# Patient Record
Sex: Male | Born: 1970 | Race: Black or African American | Hispanic: No | Marital: Single | Smoking: Never smoker
Health system: Southern US, Community
[De-identification: ages and names within clinical notes are randomized; demographics above are authoritative.]

## PROBLEM LIST (undated history)

## (undated) DIAGNOSIS — K219 Gastro-esophageal reflux disease without esophagitis: Secondary | ICD-10-CM

## (undated) DIAGNOSIS — K439 Ventral hernia without obstruction or gangrene: Secondary | ICD-10-CM

---

## 2004-01-17 ENCOUNTER — Emergency Department (HOSPITAL_COMMUNITY): Admission: EM | Admit: 2004-01-17 | Discharge: 2004-01-17 | Payer: Self-pay | Admitting: Emergency Medicine

## 2004-01-25 ENCOUNTER — Emergency Department (HOSPITAL_COMMUNITY): Admission: EM | Admit: 2004-01-25 | Discharge: 2004-01-25 | Payer: Self-pay | Admitting: Family Medicine

## 2005-05-10 ENCOUNTER — Emergency Department (HOSPITAL_COMMUNITY): Admission: EM | Admit: 2005-05-10 | Discharge: 2005-05-10 | Payer: Self-pay | Admitting: Emergency Medicine

## 2005-05-22 ENCOUNTER — Emergency Department (HOSPITAL_COMMUNITY): Admission: EM | Admit: 2005-05-22 | Discharge: 2005-05-22 | Payer: Self-pay | Admitting: Emergency Medicine

## 2007-11-05 ENCOUNTER — Emergency Department (HOSPITAL_COMMUNITY): Admission: EM | Admit: 2007-11-05 | Discharge: 2007-11-05 | Payer: Self-pay | Admitting: *Deleted

## 2010-11-29 ENCOUNTER — Emergency Department (HOSPITAL_COMMUNITY)
Admission: EM | Admit: 2010-11-29 | Discharge: 2010-11-29 | Disposition: A | Discharge status: Home / Self Care | Payer: Worker's Compensation | Attending: Emergency Medicine | Admitting: Emergency Medicine

## 2010-11-29 DIAGNOSIS — Y93G1 Activity, food preparation and clean up: Secondary | ICD-10-CM | POA: Insufficient documentation

## 2010-11-29 DIAGNOSIS — Z23 Encounter for immunization: Secondary | ICD-10-CM | POA: Insufficient documentation

## 2010-11-29 DIAGNOSIS — S61409A Unspecified open wound of unspecified hand, initial encounter: Secondary | ICD-10-CM | POA: Insufficient documentation

## 2010-11-29 DIAGNOSIS — W260XXA Contact with knife, initial encounter: Secondary | ICD-10-CM | POA: Insufficient documentation

## 2013-10-20 ENCOUNTER — Emergency Department (INDEPENDENT_AMBULATORY_CARE_PROVIDER_SITE_OTHER)
Admission: EM | Admit: 2013-10-20 | Discharge: 2013-10-20 | Disposition: A | Discharge status: Home / Self Care | Payer: Self-pay | Source: Home / Self Care | Attending: Family Medicine | Admitting: Family Medicine

## 2013-10-20 ENCOUNTER — Encounter (HOSPITAL_COMMUNITY): Payer: Self-pay | Admitting: Emergency Medicine

## 2013-10-20 DIAGNOSIS — K047 Periapical abscess without sinus: Secondary | ICD-10-CM

## 2013-10-20 MED ORDER — CLINDAMYCIN HCL 300 MG PO CAPS: 300.0000 mg | ORAL_CAPSULE | Freq: Three times a day (TID) | ORAL | Status: DC

## 2013-10-20 NOTE — Discharge Instructions (Signed)
Take medicine as prescribed, see your dentist as soon as possible °

## 2013-10-20 NOTE — ED Notes (Signed)
C/o dental pain on left side of mouth States he has an abscess No dentist appt scheduled. Mouth is swollen and sensitive

## 2013-10-20 NOTE — ED Provider Notes (Signed)
CSN: [id number]     Arrival date & time 10/20/13  [id number] History   First MD Initiated Contact with Patient 10/20/13 [phone]     Chief Complaint  Patient presents with  . Dental Pain   (Consider location/radiation/quality/duration/timing/severity/associated sxs/prior Treatment) Patient is a 43 y.o. male presenting with tooth pain. The history is provided by the patient.  Dental Pain Location:  Lower Lower teeth location:  18/LL 2nd molar and 31/RL 2nd molar Quality:  Aching Severity:  Moderate Onset quality:  Sudden Duration:  1 day Progression:  Worsening Chronicity:  Recurrent Context: dental caries and poor dentition   Associated symptoms: facial swelling   Associated symptoms: no difficulty swallowing and no gum swelling   Risk factors: lack of dental care     History reviewed. No pertinent past medical history. No past surgical history on file. No family history on file. History  Substance Use Topics  . Smoking status: Not on file  . Smokeless tobacco: Not on file  . Alcohol Use: Not on file    Review of Systems  Constitutional: Negative.   HENT: Positive for dental problem and facial swelling.     Allergies  Review of patient's allergies indicates no known allergies.  Home Medications   Prior to Admission medications   Medication Sig Start Date End Date Taking? Authorizing Provider  clindamycin (CLEOCIN) 300 MG capsule Take 1 capsule (300 mg total) by mouth 3 (three) times daily. 10/20/13   Linna HoffJames D Mariely Mahr, MD   BP 140/98  Pulse 83  Temp(Src) 98.2 F (36.8 C) (Oral)  Resp 18  Ht 5' 8.5" (1.74 m)  Wt 140 lb (63.504 kg)  BMI 20.98 kg/m2  SpO2 96% Physical Exam  Nursing note and vitals reviewed. Constitutional: He is oriented to person, place, and time. He appears well-developed and well-nourished. No distress.  HENT:  Mouth/Throat: Oropharynx is clear and moist. Abnormal dentition. Dental caries present.    Left mandible sts, nontender.  Neck: Normal range of  motion. Neck supple.  Lymphadenopathy:    He has no cervical adenopathy.  Neurological: He is alert and oriented to person, place, and time.  Skin: Skin is warm and dry.    ED Course  Procedures (including critical care time) Labs Review Labs Reviewed - No data to display  Imaging Review No results found.   MDM   1. Abscess, dental        Linna HoffJames D Jaser Fullen, MD 10/20/13 [phone]

## 2015-06-26 ENCOUNTER — Emergency Department (HOSPITAL_COMMUNITY)
Admission: EM | Admit: 2015-06-26 | Discharge: 2015-06-26 | Disposition: A | Discharge status: Home / Self Care | Payer: Worker's Compensation | Attending: Emergency Medicine | Admitting: Emergency Medicine

## 2015-06-26 ENCOUNTER — Encounter (HOSPITAL_COMMUNITY): Payer: Self-pay | Admitting: Family Medicine

## 2015-06-26 DIAGNOSIS — A084 Viral intestinal infection, unspecified: Secondary | ICD-10-CM

## 2015-06-26 DIAGNOSIS — Z792 Long term (current) use of antibiotics: Secondary | ICD-10-CM | POA: Insufficient documentation

## 2015-06-26 LAB — URINE MICROSCOPIC-ADD ON
Bacteria, UA: NONE SEEN
Squamous Epithelial / HPF: NONE SEEN

## 2015-06-26 LAB — URINALYSIS, ROUTINE W REFLEX MICROSCOPIC
Bilirubin Urine: NEGATIVE
Glucose, UA: NEGATIVE mg/dL
Ketones, ur: NEGATIVE mg/dL
Leukocytes, UA: NEGATIVE
Nitrite: NEGATIVE
Protein, ur: 30 mg/dL — AB
Specific Gravity, Urine: 1.024 (ref 1.005–1.030)
pH: 6 (ref 5.0–8.0)

## 2015-06-26 LAB — COMPREHENSIVE METABOLIC PANEL
ALT: 14 U/L — ABNORMAL LOW (ref 17–63)
AST: 21 U/L (ref 15–41)
Albumin: 4 g/dL (ref 3.5–5.0)
Alkaline Phosphatase: 40 U/L (ref 38–126)
Anion gap: 14 (ref 5–15)
BUN: 15 mg/dL (ref 6–20)
CO2: 22 mmol/L (ref 22–32)
Calcium: 9.5 mg/dL (ref 8.9–10.3)
Chloride: 98 mmol/L — ABNORMAL LOW (ref 101–111)
Creatinine, Ser: 1.15 mg/dL (ref 0.61–1.24)
GFR calc Af Amer: >60 mL/min (ref >60)
GFR calc non Af Amer: >60 mL/min (ref >60)
Glucose, Bld: 160 mg/dL — ABNORMAL HIGH (ref 65–99)
Potassium: 4.1 mmol/L (ref 3.5–5.1)
Sodium: 134 mmol/L — ABNORMAL LOW (ref 135–145)
Total Bilirubin: 1.1 mg/dL (ref 0.3–1.2)
Total Protein: 7.6 g/dL (ref 6.5–8.1)

## 2015-06-26 LAB — CBC
HCT: 44.1 % (ref 39.0–52.0)
Hemoglobin: 15.5 g/dL (ref 13.0–17.0)
MCH: 30.8 pg (ref 26.0–34.0)
MCHC: 35.1 g/dL (ref 30.0–36.0)
MCV: 87.5 fL (ref 78.0–100.0)
Platelets: 194 10*3/uL (ref 150–400)
RBC: 5.04 MIL/uL (ref 4.22–5.81)
RDW: 11.6 % (ref 11.5–15.5)
WBC: 5.9 10*3/uL (ref 4.0–10.5)

## 2015-06-26 LAB — LIPASE, BLOOD: Lipase: 18 U/L (ref 11–51)

## 2015-06-26 MED ORDER — SODIUM CHLORIDE 0.9 % IV BOLUS (SEPSIS)
1000.0000 mL | Freq: Once | INTRAVENOUS | Status: AC
  Administered 2015-06-26: 1000 mL via INTRAVENOUS

## 2015-06-26 MED ORDER — ONDANSETRON HCL 4 MG/2ML IJ SOLN
4.0000 mg | Freq: Once | INTRAMUSCULAR | Status: AC
  Administered 2015-06-26: 4 mg via INTRAVENOUS
  Filled 2015-06-26: qty 2

## 2015-06-26 MED ORDER — ONDANSETRON 4 MG PO TBDP: 4.0000 mg | ORAL_TABLET | Freq: Three times a day (TID) | ORAL | Status: DC | PRN

## 2015-06-26 NOTE — ED Notes (Signed)
Pt stable, ambulatory, states understanding of discharge instructions 

## 2015-06-26 NOTE — ED Provider Notes (Signed)
CSN: [id number]     Arrival date & time 06/26/15  1254 History   First MD Initiated Contact with Patient 06/26/15 1407     Chief Complaint  Patient presents with  . Nausea  . Emesis  . Diarrhea     (Consider location/radiation/quality/duration/timing/severity/associated sxs/prior Treatment) HPI   Dalton Mason is a 45 y.o M with no significant pmhx who presents to the ED today c/o emesis and weakness. Pt states that 4 days ago he had multiple episodes of diarrhea and emesis. Pt states this continued for 2 days. Since that time pt feels very weak. He has not had much to eat or drink over the last 4 days. He states that he feels dehydrated. While in the ED, pt developed the hiccups and began coughing which resulted in posttussive emesis. Denies abdominal pain, fevers, chills, dysuria, melena, hematochezia, dizziness, back or flank pain.   History reviewed. No pertinent past medical history. History reviewed. No pertinent past surgical history. History reviewed. No pertinent family history. Social History  Substance Use Topics  . Smoking status: Never Smoker   . Smokeless tobacco: None  . Alcohol Use: No    Review of Systems  All other systems reviewed and are negative.     Allergies  Review of patient's allergies indicates no known allergies.  Home Medications   Prior to Admission medications   Medication Sig Start Date End Date Taking? Authorizing Provider  clindamycin (CLEOCIN) 300 MG capsule Take 1 capsule (300 mg total) by mouth 3 (three) times daily. 10/20/13   Linna Hoff, MD   BP 151/115 mmHg  Pulse 78  Temp(Src) 97.5 F (36.4 C)  Resp 18  SpO2 100% Physical Exam  Constitutional: He is oriented to person, place, and time. He appears well-developed and well-nourished. No distress.  HENT:  Head: Normocephalic and atraumatic.  Mouth/Throat: No oropharyngeal exudate.  Eyes: Conjunctivae and EOM are normal. Pupils are equal, round, and reactive to light. Right eye  exhibits no discharge. Left eye exhibits no discharge. No scleral icterus.  Neck: Neck supple.  Cardiovascular: Normal rate, regular rhythm, normal heart sounds and intact distal pulses.  Exam reveals no gallop and no friction rub.   No murmur heard. Pulmonary/Chest: Effort normal and breath sounds normal. No respiratory distress. He has no wheezes. He has no rales. He exhibits no tenderness.  Abdominal: Soft. He exhibits no distension and no mass. There is no tenderness. There is no rebound and no guarding.  Musculoskeletal: Normal range of motion. He exhibits no edema.  Lymphadenopathy:    He has no cervical adenopathy.  Neurological: He is alert and oriented to person, place, and time.  Strength 5/5 throughout. No sensory deficits.    Skin: Skin is warm and dry. No rash noted. He is not diaphoretic. No erythema. No pallor.  Psychiatric: He has a normal mood and affect. His behavior is normal.  Nursing note and vitals reviewed.   ED Course  Procedures (including critical care time) Labs Review Labs Reviewed  CBC  LIPASE, BLOOD  COMPREHENSIVE METABOLIC PANEL  URINALYSIS, ROUTINE W REFLEX MICROSCOPIC (NOT AT [facility])    Imaging Review No results found. I have personally reviewed and evaluated these images and lab results as part of my medical decision-making.   EKG Interpretation None      MDM   Final diagnoses:  Viral gastroenteritis    Patient with symptoms consistent with viral gastroenteritis.  Vitals are stable, no fever.  Pt given IV fluids and zofran  with significant symptomatic relief. No signs of dehydration, tolerating PO fluids > 6 oz.  Lungs are clear.  No focal abdominal pain, no concern for appendicitis, cholecystitis, pancreatitis, ruptured viscus, UTI, kidney stone, or any other abdominal etiology. All lab work wnl. Supportive therapy indicated with return if symptoms worsen.  Patient counseled.     Lester KinsmanSamantha Tripp CloverDowless, PA-C 06/26/15 2045  Lorre NickAnthony  Allen, MD 06/29/15 [phone]

## 2015-06-26 NOTE — Discharge Instructions (Signed)

## 2015-06-26 NOTE — ED Notes (Signed)
Pt here for viral symptoms since Thursday. sts N,V,D until 2 days ago. He sta feels weak and dehydrated. sts some abd pain.

## 2015-11-05 ENCOUNTER — Emergency Department (HOSPITAL_COMMUNITY)
Admission: EM | Admit: 2015-11-05 | Discharge: 2015-11-05 | Disposition: A | Discharge status: Home / Self Care | Payer: Worker's Compensation | Attending: Physician Assistant | Admitting: Physician Assistant

## 2015-11-05 ENCOUNTER — Encounter (HOSPITAL_COMMUNITY): Payer: Self-pay

## 2015-11-05 ENCOUNTER — Emergency Department (HOSPITAL_COMMUNITY): Payer: Worker's Compensation

## 2015-11-05 DIAGNOSIS — R05 Cough: Secondary | ICD-10-CM

## 2015-11-05 DIAGNOSIS — R059 Cough, unspecified: Secondary | ICD-10-CM

## 2015-11-05 MED ORDER — PREDNISONE 20 MG PO TABS: 40.0000 mg | ORAL_TABLET | Freq: Every day | ORAL | Status: DC

## 2015-11-05 MED ORDER — CETIRIZINE-PSEUDOEPHEDRINE ER 5-120 MG PO TB12: 1.0000 | ORAL_TABLET | Freq: Every day | ORAL | Status: DC

## 2015-11-05 NOTE — ED Provider Notes (Signed)
History  By signing my name below, I, Earmon Phoenix, attest that this documentation has been prepared under the direction and in the presence of Quinlee Sciarra, PA-C. Electronically Signed: Earmon Phoenix, ED Scribe. 11/05/2015. 12:33 PM.  Chief Complaint  Patient presents with  . Cough   The history is provided by the patient and medical records. No language interpreter was used.    HPI Comments:  Dalton Mason is a 45 y.o. male who presents to the Emergency Department complaining of an intermittent productive cough of phlegm that began about one month ago. He reports occasional sneezing. He has been taking an OTC cough medication with no significant relief of the symptoms. Exertion causes him to cough more. He denies alleviating factors. He denies fever, chills, HA, nasal congestion, post nasal drip, nausea, vomiting, SOB, wheezing. He denies ever smoking. He denies h/o asthma. He does not have a PCP.   History reviewed. No pertinent past medical history. History reviewed. No pertinent past surgical history. No family history on file. Social History  Substance Use Topics  . Smoking status: Never Smoker   . Smokeless tobacco: None  . Alcohol Use: No    Review of Systems  Constitutional: Negative for fever and chills.  HENT: Positive for sneezing. Negative for congestion and postnasal drip.   Respiratory: Positive for cough. Negative for shortness of breath and wheezing.   Gastrointestinal: Negative for nausea and vomiting.  Neurological: Negative for headaches.    Allergies  Review of patient's allergies indicates no known allergies.  Home Medications   Prior to Admission medications   Medication Sig Start Date End Date Taking? Authorizing Provider  clindamycin (CLEOCIN) 300 MG capsule Take 1 capsule (300 mg total) by mouth 3 (three) times daily. 10/20/13   Linna Hoff, MD  ondansetron (ZOFRAN ODT) 4 MG disintegrating tablet Take 1 tablet (4 mg total) by mouth every  8 (eight) hours as needed for nausea or vomiting. 06/26/15   Dub Mikes, PA-C   Triage Vitals: BP 146/89 mmHg  Pulse 74  Temp(Src) 98.4 F (36.9 C) (Oral)  Resp 18  Ht  (1.727 m)  Wt 140 lb (63.504 kg)  BMI 21.29 kg/m2  SpO2 98% Physical Exam  Constitutional: He is oriented to person, place, and time. He appears well-developed and well-nourished.  HENT:  Head: Normocephalic and atraumatic.  Mouth/Throat: Oropharynx is clear and moist.  Eyes: Conjunctivae and EOM are normal. Pupils are equal, round, and reactive to light.  Neck: Normal range of motion.  Cardiovascular: Normal rate, regular rhythm and normal heart sounds.   Pulmonary/Chest: Effort normal and breath sounds normal. No respiratory distress. He has no wheezes. He has no rales.  Musculoskeletal: Normal range of motion.  Neurological: He is alert and oriented to person, place, and time.  Skin: Skin is warm and dry.  Psychiatric: He has a normal mood and affect. His behavior is normal.  Nursing note and vitals reviewed.   ED Course  Procedures (including critical care time) DIAGNOSTIC STUDIES: Oxygen Saturation is 98% on RA, normal by my interpretation.   COORDINATION OF CARE: 12:33 PM- Will prescribe steroid and antihistamine. Pt verbalizes understanding and agrees to plan.  Medications - No data to display  Labs Review Labs Reviewed - No data to display  Imaging Review Dg Chest 2 View  11/05/2015  CLINICAL DATA:  X 1 month patient has had a cough. EXAM: CHEST  2 VIEW COMPARISON:  None. FINDINGS: Midline trachea. Normal heart size and  mediastinal contours. No pleural effusion or pneumothorax. Clear lungs. Minimal biapical pleural-parenchymal scarring. Mild S-shaped thoracolumbar spine curvature. IMPRESSION: No acute cardiopulmonary disease. Electronically Signed   By: Jeronimo Greaves M.D.   On: 11/05/2015 12:28   I have personally reviewed and evaluated these images and lab results as part of my  medical decision-making.   EKG Interpretation None      MDM   Final diagnoses:  Cough    Patient with cough for about a month, states feels mucus in his throat that's causing him to cough. No other associated symptoms or complaints. Afebrile. Nontoxic-appearing. Normal oxygen saturation respiratory rate. No chest pain or shortness of breath. X-ray is negative. Question inflammatory processes versus allergies. Will start on Zyrtec, prednisone for inflammation for 5 days, short bursts, will follow up with family doctor.  Filed Vitals:   11/05/15 1126  BP: 146/89  Pulse: 74  Temp: 98.4 F (36.9 C)  TempSrc: Oral  Resp: 18  Height: 5\' 8"  (1.727 m)  Weight: 63.504 kg  SpO2: 98%    I personally performed the services described in this documentation, which was scribed in my presence. The recorded information has been reviewed and is accurate.   Jaynie Crumble, PA-C 11/05/15 1248  Courteney Randall An, MD 11/05/15 [phone]

## 2015-11-05 NOTE — ED Notes (Signed)
Patient here with cough x 1 month, reports feels like mucus sitting in throat, NAD. Thinks allergies or sinus drainage

## 2015-11-05 NOTE — Discharge Instructions (Signed)
Take zyrtec d daily. Take prednisone as prescribed until all gone. Follow up with family doctor for recheck. Return if worsening.    Allergies An allergy is an abnormal reaction to a substance by the body's defense system (immune system). Allergies can develop at any age. WHAT CAUSES ALLERGIES? An allergic reaction happens when the immune system mistakenly reacts to a normally harmless substance, called an allergen, as if it were harmful. The immune system releases antibodies to fight the substance. Antibodies eventually release a chemical called histamine into the bloodstream. The release of histamine is meant to protect the body from infection, but it also causes discomfort. An allergic reaction can be triggered by:  Eating an allergen.  Inhaling an allergen.  Touching an allergen. WHAT TYPES OF ALLERGIES ARE THERE? There are many types of allergies. Common types include:  Seasonal allergies. People with this type of allergy are usually allergic to substances that are only present during certain seasons, such as molds and pollens.  Food allergies.  Drug allergies.  Insect allergies.  Animal dander allergies. WHAT ARE SYMPTOMS OF ALLERGIES? Possible allergy symptoms include:  Swelling of the lips, face, tongue, mouth, or throat.  Sneezing, coughing, or wheezing.  Nasal congestion.  Tingling in the mouth.  Rash.  Itching.  Itchy, red, swollen areas of skin (hives).  Watery eyes.  Vomiting.  Diarrhea.  Dizziness.  Lightheadedness.  Fainting.  Trouble breathing or swallowing.  Chest tightness.  Rapid heartbeat. HOW ARE ALLERGIES DIAGNOSED? Allergies are diagnosed with a medical and family history and one or more of the following:  Skin tests.  Blood tests.  A food diary. A food diary is a record of all the foods and drinks you have in a day and of all the symptoms you experience.  The results of an elimination diet. An elimination diet involves  eliminating foods from your diet and then adding them back in one by one to find out if a certain food causes an allergic reaction. HOW ARE ALLERGIES TREATED? There is no cure for allergies, but allergic reactions can be treated with medicine. Severe reactions usually need to be treated at a hospital. HOW CAN REACTIONS BE PREVENTED? The best way to prevent an allergic reaction is by avoiding the substance you are allergic to. Allergy shots and medicines can also help prevent reactions in some cases. People with severe allergic reactions may be able to prevent a life-threatening reaction called anaphylaxis with a medicine given right after exposure to the allergen.   This information is not intended to replace advice given to you by your health care provider. Make sure you discuss any questions you have with your health care provider.   Document Released: 06/26/2002 Document Revised: 04/23/2014 Document Reviewed: 01/12/2014 Elsevier Interactive Patient Education [employer]

## 2016-01-30 ENCOUNTER — Encounter (HOSPITAL_COMMUNITY): Payer: Self-pay | Admitting: Emergency Medicine

## 2016-01-30 ENCOUNTER — Ambulatory Visit (HOSPITAL_COMMUNITY)
Admission: EM | Admit: 2016-01-30 | Discharge: 2016-01-30 | Disposition: A | Discharge status: Home / Self Care | Payer: Self-pay | Attending: Emergency Medicine | Admitting: Emergency Medicine

## 2016-01-30 DIAGNOSIS — A09 Infectious gastroenteritis and colitis, unspecified: Secondary | ICD-10-CM

## 2016-01-30 DIAGNOSIS — R197 Diarrhea, unspecified: Secondary | ICD-10-CM

## 2016-01-30 NOTE — ED Provider Notes (Signed)
CSN: [id number]     Arrival date & time 01/30/16  1419 History   First MD Initiated Contact with Patient 01/30/16 1630     Chief Complaint  Patient presents with  . 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 He states it started out with watery diarrhea stools without evidence of blood after eating 3 days ago. He had watery to loose stools until today. This morning his stools were loose and beginning to form although still "runny". Denies associated abdominal pain or discomfort and no fever or chills. He has had no diarrhea since he ate breakfast this morning. He states he feels well otherwise.      History reviewed. No pertinent past medical history. History reviewed. No pertinent surgical history. No family history on file. Social History  Substance Use Topics  . Smoking status: Never Smoker  . Smokeless tobacco: Never Used  . Alcohol use No    Review of Systems  Constitutional: Positive for activity change. Negative for chills and fever.  HENT: Negative.   Respiratory: Negative.   Cardiovascular: Negative.   Gastrointestinal: Positive for diarrhea. Negative for abdominal distention, abdominal pain, blood in stool, constipation, nausea, rectal pain and vomiting.  Genitourinary: Negative.   Musculoskeletal: Negative.   Neurological: Negative for dizziness and headaches.  All other systems reviewed and are negative.   Allergies  Review of patient's allergies indicates no known allergies.  Home Medications   Prior to Admission medications   Medication Sig Start Date End Date Taking? Authorizing Provider  cetirizine-pseudoephedrine (ZYRTEC-D) 5-120 MG tablet Take 1 tablet by mouth daily. 11/05/15   Tatyana Kirichenko, PA-C   Meds Ordered and Administered this Visit  Medications - No data to display  BP 130/89 (BP  Location: Left Arm)   Pulse 64   Temp 98.3 F (36.8 C) (Oral)   Resp 12   SpO2 100%  No data found.   Physical Exam  Constitutional: He is oriented to person, place, and time. He appears well-developed and well-nourished. No distress.  Eyes: EOM are normal.  Neck: Neck supple.  Cardiovascular: Normal rate, regular rhythm and normal heart sounds.   Pulmonary/Chest: Effort normal and breath sounds normal. No respiratory distress. He has no wheezes.  Abdominal: Soft. Bowel sounds are normal. He exhibits no distension and no mass. There is no tenderness. There is no rebound and no guarding.  Musculoskeletal: He exhibits no edema.  Neurological: He is alert and oriented to person, place, and time. He exhibits normal muscle tone.  Skin: Skin is warm and dry.  Psychiatric: He has a normal mood and affect.  Nursing note and vitals reviewed.   Urgent Care Course   Clinical Course    Procedures (including critical care time)  Labs Review Labs Reviewed - No data to display  Imaging Review No results found.   Visual Acuity Review  Right Eye Distance:   Left Eye Distance:   Bilateral Distance:    Right Eye Near:   Left Eye Near:    Bilateral Near:         MDM   1. Diarrhea of presumed infectious origin    Sounds like your diarrhea is improving. Drink plenty of clear liquids for rehydration. Stay away from dairy products, fatty or greasy foods, fast food's for the next 24-48 hours. Eat bland meals, oatmeal, cooked vegetables. Make sure you wash her hands frequently and after using the  bathroom. Read the instructions associated with these papers. Out of work tomorrow and if having no symptoms of following day may return to work.     Hayden Rasmussen, NP 01/30/16 [phone]

## 2016-01-30 NOTE — Discharge Instructions (Signed)
Sounds like your diarrhea is improving. Drink plenty of clear liquids for rehydration. Stay away from dairy products, fatty or greasy foods, fast food's for the next 24-48 hours. Eat bland meals, oatmeal, cooked vegetables. Make sure you wash her hands frequently and after using the bathroom. Read the instructions associated with these papers. Out of work tomorrow and if having no symptoms of following day may return to work.

## 2016-01-30 NOTE — ED Triage Notes (Signed)
Patient reports Friday evening he started with abdominal cramping after eating.  Patient continued with diarrhea after eating or drinking over the week end.  Patient has continued bubbly stomach, but diarrhea episodes have decreased and had a semi-soft stool this morning.  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

## 2016-05-25 ENCOUNTER — Ambulatory Visit (HOSPITAL_COMMUNITY)
Admission: EM | Admit: 2016-05-25 | Discharge: 2016-05-25 | Disposition: A | Discharge status: Home / Self Care | Payer: Self-pay | Attending: Family Medicine | Admitting: Family Medicine

## 2016-05-25 ENCOUNTER — Encounter (HOSPITAL_COMMUNITY): Payer: Self-pay | Admitting: Emergency Medicine

## 2016-05-25 ENCOUNTER — Ambulatory Visit (INDEPENDENT_AMBULATORY_CARE_PROVIDER_SITE_OTHER): Payer: Self-pay

## 2016-05-25 DIAGNOSIS — J40 Bronchitis, not specified as acute or chronic: Secondary | ICD-10-CM

## 2016-05-25 DIAGNOSIS — H6122 Impacted cerumen, left ear: Secondary | ICD-10-CM

## 2016-05-25 DIAGNOSIS — K4090 Unilateral inguinal hernia, without obstruction or gangrene, not specified as recurrent: Secondary | ICD-10-CM

## 2016-05-25 DIAGNOSIS — J410 Simple chronic bronchitis: Secondary | ICD-10-CM

## 2016-05-25 MED ORDER — PREDNISONE 20 MG PO TABS: ORAL_TABLET | ORAL | 0 refills | Status: DC

## 2016-05-25 MED ORDER — OMEPRAZOLE 20 MG PO CPDR: 20.0000 mg | DELAYED_RELEASE_CAPSULE | Freq: Every day | ORAL | 0 refills | Status: DC

## 2016-05-25 NOTE — ED Provider Notes (Signed)
[facility]    CSN: [id number] Arrival date & time: 05/25/16  1655     History   Chief Complaint Chief Complaint  Patient presents with  . Cough    HPI Dalton Mason is a 46 y.o. male.   Pt reports having a cough for one year.  He was prescribed Zyrtec at one time, but it does not help. He also reports a productive cough, yellow phlegm. He says that actually he's had a cough since 2006 and he associates this with a hernia in his right groin.  He also notes that he can't hear well out of his left ear and would like his ear cleaned out.  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 He does not smoke or drink.      History reviewed. No pertinent past medical history.  There are no active problems to display for this patient.   History reviewed. No pertinent surgical history.     Home Medications    Prior to Admission medications   Medication Sig Start Date End Date Taking? Authorizing Provider  cetirizine-pseudoephedrine (ZYRTEC-D) 5-120 MG tablet Take 1 tablet by mouth daily. 11/05/15  Yes Tatyana Kirichenko, PA-C  omeprazole (PRILOSEC) 20 MG capsule Take 1 capsule (20 mg total) by mouth daily. 05/25/16   Elvina Sidle, MD  predniSONE (DELTASONE) 20 MG tablet Two daily with food 05/25/16   Elvina Sidle, MD    Family History History reviewed. No pertinent family history.  Social History Social History  Substance Use Topics  . Smoking status: Never Smoker  . Smokeless tobacco: Never Used  . Alcohol use No     Allergies   Patient has no known allergies.   Review of Systems Review of Systems  Constitutional: Negative.   HENT: Positive for hearing loss.   Respiratory: Positive for cough.   Gastrointestinal: Positive for abdominal pain.  Genitourinary: Negative.   Musculoskeletal: Negative.      Physical Exam Triage Vital Signs ED Triage Vitals [05/25/16 1717]  Enc Vitals Group     BP 128/83     Pulse Rate 80     Resp      Temp 99.4 F  (37.4 C)     Temp Source Oral     SpO2 98 %     Weight      Height      Head Circumference      Peak Flow      Pain Score 0     Pain Loc      Pain Edu?      Excl. in GC?    No data found.   Updated Vital Signs BP 128/83 (BP Location: Left Arm)   Pulse 80   Temp 99.4 F (37.4 C) (Oral)   SpO2 98%    Physical Exam  Constitutional: He is oriented to person, place, and time. He appears well-developed and well-nourished.  HENT:  Head: Normocephalic.  Right Ear: External ear normal.  Poor dental hygiene Left cerumen impaction  Eyes: Conjunctivae and EOM are normal. Pupils are equal, round, and reactive to light.  Neck: Normal range of motion. Neck supple.  Cardiovascular: Normal rate, regular rhythm and normal heart sounds.   Pulmonary/Chest: Effort normal. He has wheezes. He has rales.  Musculoskeletal: Normal range of motion.  Neurological: He is alert and oriented to person, place, and time.  Skin: Skin is warm and dry.  Nursing note and vitals reviewed.    UC Treatments / Results  Labs (all  labs ordered are listed, but only abnormal results are displayed) Labs Reviewed - No data to display  EKG  EKG Interpretation None       Radiology No results found.  Procedures Procedures (including critical care time)  Medications Ordered in UC Medications - No data to display   Initial Impression / Assessment and Plan / UC Course  I have reviewed the triage vital signs and the nursing notes.  Pertinent labs & imaging results that were available during my care of the patient were reviewed by me and considered in my medical decision making (see chart for details).     Final Clinical Impressions(s) / UC Diagnoses   Final diagnoses:  Bronchitis  Impacted cerumen of left ear  Simple chronic bronchitis (HCC)  Inguinal hernia of right side without obstruction or gangrene    New Prescriptions New Prescriptions   OMEPRAZOLE (PRILOSEC) 20 MG CAPSULE    Take 1  capsule (20 mg total) by mouth daily.   PREDNISONE (DELTASONE) 20 MG TABLET    Two daily with food     Elvina SidleKurt Leila Schuff, MD 05/25/16 [phone]

## 2016-05-25 NOTE — ED Triage Notes (Signed)
Pt reports having a cough for one year.  He was prescribed Zyrtec at one time, but it does not help. He also reports a productive cough, yellow phlegm.

## 2016-05-25 NOTE — Discharge Instructions (Signed)
For hernia repair, call [facility]

## 2016-10-16 ENCOUNTER — Ambulatory Visit (HOSPITAL_COMMUNITY)
Admission: EM | Admit: 2016-10-16 | Discharge: 2016-10-16 | Disposition: A | Discharge status: Home / Self Care | Payer: Self-pay | Attending: Internal Medicine | Admitting: Internal Medicine

## 2016-10-16 ENCOUNTER — Encounter (HOSPITAL_COMMUNITY): Payer: Self-pay | Admitting: *Deleted

## 2016-10-16 DIAGNOSIS — B349 Viral infection, unspecified: Secondary | ICD-10-CM

## 2016-10-16 NOTE — ED Triage Notes (Signed)
Fever        Yesterday       Weak   decreased   Appetite   No  Vomiting       no  Diarrhea

## 2016-10-16 NOTE — ED Notes (Signed)
Bed: UCTR Expected date:  Expected time:  Means of arrival: Car Comments:

## 2016-10-16 NOTE — ED Provider Notes (Signed)
CSN: [id number]     Arrival date & time 10/16/16  1201 History   None    Chief Complaint  Patient presents with  . Fever   (Consider location/radiation/quality/duration/timing/severity/associated sxs/prior Treatment) Patient c/o fever yesterday, low appetite, and fatigue.  He was sent home from work today.   The history is provided by the patient.  Fever  Temp source:  Subjective Severity:  Moderate Onset quality:  Sudden Duration:  2 days Timing:  Constant Chronicity:  New Relieved by:  Nothing Worsened by:  Nothing Ineffective treatments:  None tried   History reviewed. No pertinent past medical history. History reviewed. No pertinent surgical history. History reviewed. No pertinent family history. Social History  Substance Use Topics  . Smoking status: Never Smoker  . Smokeless tobacco: Never Used  . Alcohol use No    Review of Systems  Constitutional: Positive for fatigue and fever.  HENT: Negative.   Eyes: Negative.   Respiratory: Negative.   Cardiovascular: Negative.   Gastrointestinal: Negative.   Endocrine: Negative.   Genitourinary: Negative.   Musculoskeletal: Negative.   Allergic/Immunologic: Negative.   Neurological: Negative.   Hematological: Negative.   Psychiatric/Behavioral: Negative.     Allergies  Patient has no known allergies.  Home Medications   Prior to Admission medications   Medication Sig Start Date End Date Taking? Authorizing Provider  ibuprofen (ADVIL,MOTRIN) 200 MG tablet Take 200 mg by mouth every 6 (six) hours as needed.   Yes [provider]  cetirizine-pseudoephedrine (ZYRTEC-D) 5-120 MG tablet Take 1 tablet by mouth daily. 11/05/15   Kirichenko, Lemont Fillersatyana, PA-C  omeprazole (PRILOSEC) 20 MG capsule Take 1 capsule (20 mg total) by mouth daily. 05/25/16   Elvina SidleLauenstein, Kurt, MD  predniSONE (DELTASONE) 20 MG tablet Two daily with food 05/25/16   Elvina SidleLauenstein, Kurt, MD   Meds Ordered and Administered this Visit  Medications - No  data to display  BP (!) 145/97 (BP Location: Left Arm) Comment: notified rn  Pulse 79   Temp 97.7 F (36.5 C) (Oral)   Resp 16   SpO2 100%  No data found.   Physical Exam  Constitutional: He is oriented to person, place, and time. He appears well-developed and well-nourished.  HENT:  Head: Normocephalic and atraumatic.  Eyes: Conjunctivae and EOM are normal. Pupils are equal, round, and reactive to light.  Neck: Normal range of motion. Neck supple.  Cardiovascular: Normal rate, regular rhythm and normal heart sounds.   Pulmonary/Chest: Effort normal and breath sounds normal.  Abdominal: Soft. Bowel sounds are normal.  Neurological: He is alert and oriented to person, place, and time.  Nursing note and vitals reviewed.   Urgent Care Course     Procedures (including critical care time)  Labs Review Labs Reviewed - No data to display  Imaging Review No results found.   Visual Acuity Review  Right Eye Distance:   Left Eye Distance:   Bilateral Distance:    Right Eye Near:   Left Eye Near:    Bilateral Near:         MDM   1. Viral syndrome   Take Nausea meds from home as rx'd if nauseated Work note Push po fluids, rest, tylenol and motrin otc prn as directed for fever, arthralgias, and myalgias.  Follow up prn if sx's continue or persist.    Deatra CanterOxford, Marita Burnsed J, FNP 10/16/16 1357

## 2016-10-16 NOTE — Discharge Instructions (Signed)
Push po fluids, rest, tylenol and motrin otc prn as directed for fever, arthralgias, and myalgias.  Follow up prn if sx's continue or persist.  Take nausea medicine from home if nauseated.

## 2017-02-05 IMAGING — DX DG CHEST 2V
2 series · 2 of 2 positions shown · non-contrast
Comparison: None.

CLINICAL DATA: X 1 month patient has had a cough.

EXAM:
CHEST  2 VIEW

[w chest lat (1 of 2)]
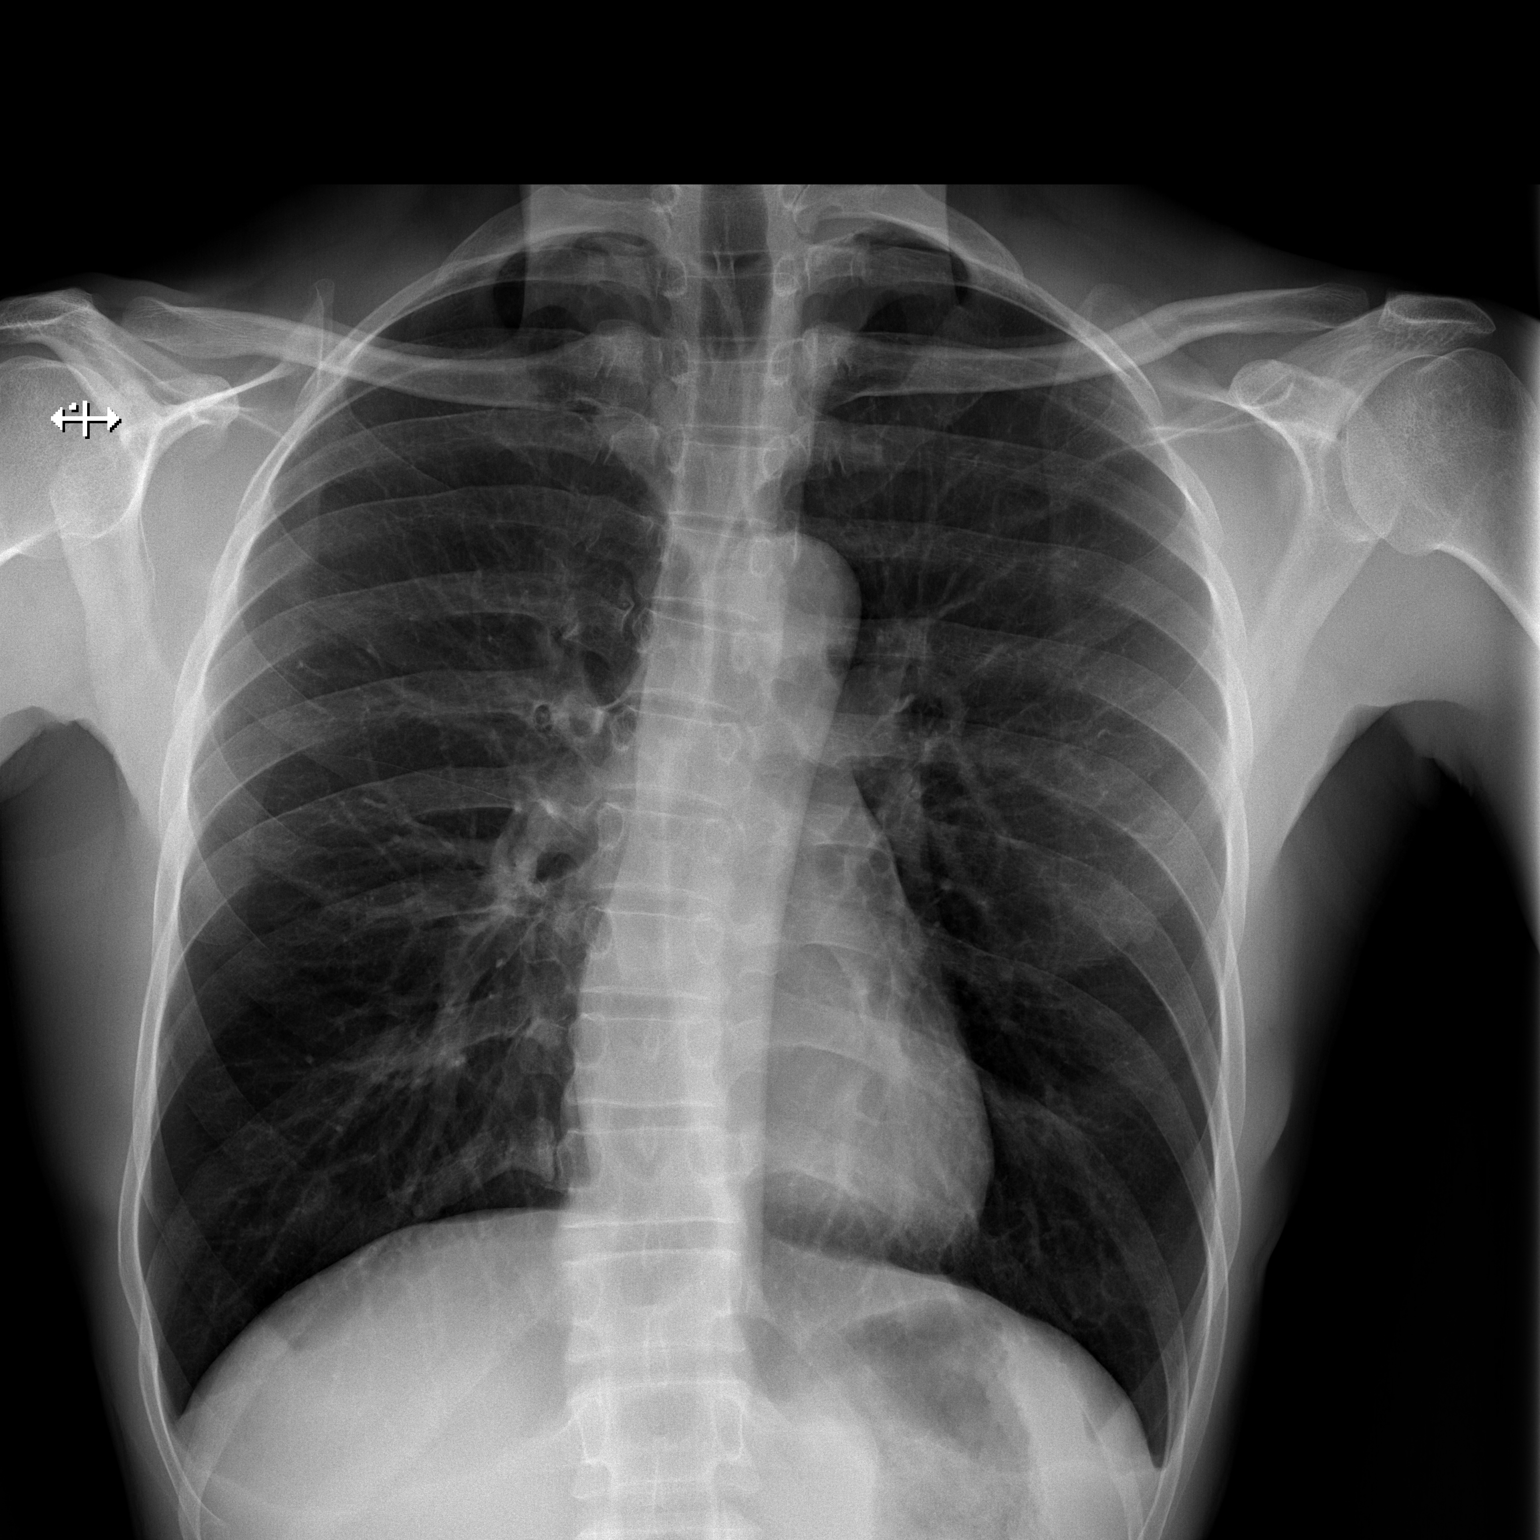

[w chest lat (2 of 2)]
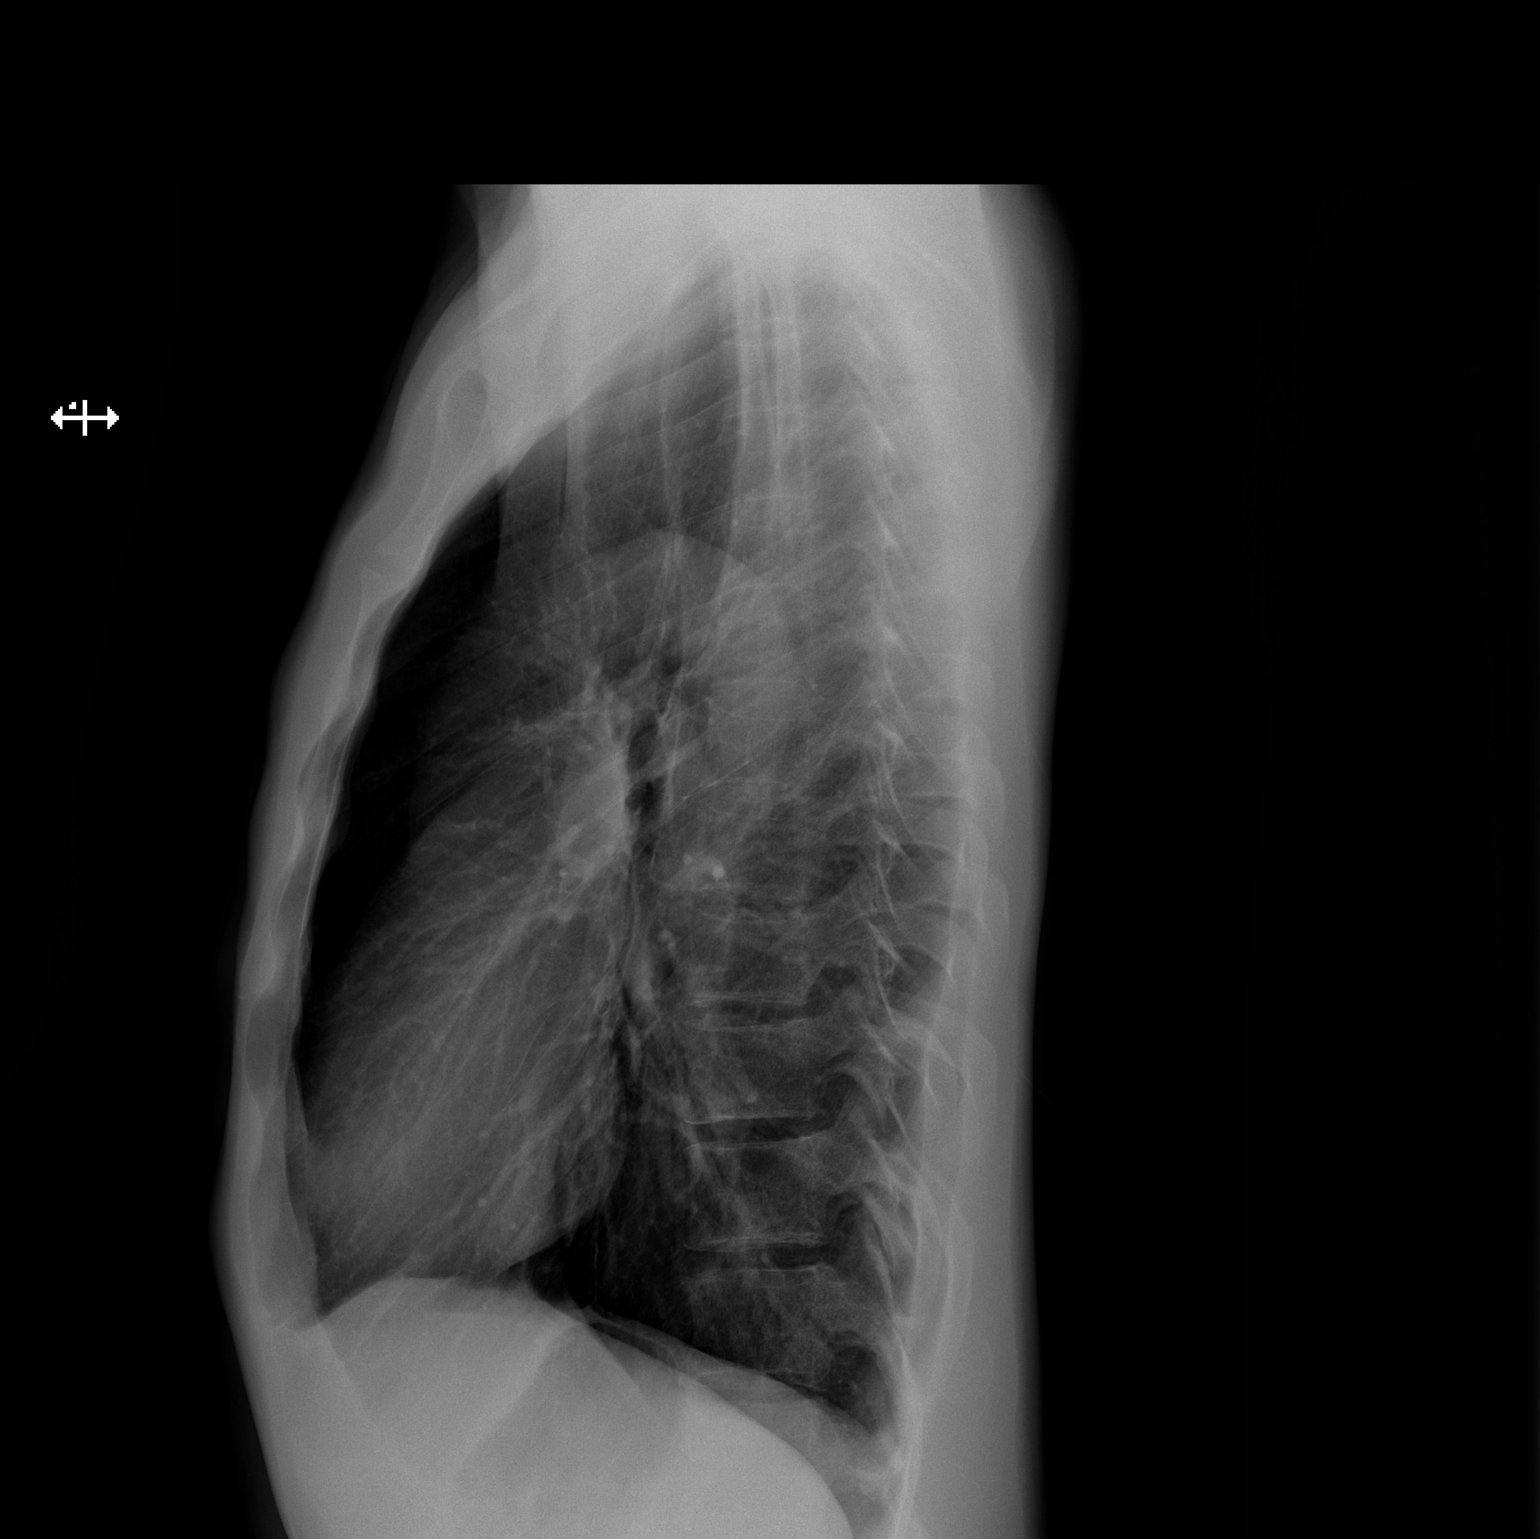

[2 of 2 positions shown; findings below may reference images not displayed]

FINDINGS: Midline trachea. Normal heart size and mediastinal contours. No
pleural effusion or pneumothorax. Clear lungs. Minimal biapical
pleural-parenchymal scarring. Mild S-shaped thoracolumbar spine
curvature.
IMPRESSION: No acute cardiopulmonary disease.

## 2017-08-26 IMAGING — DX DG CHEST 2V
2 series · 2 of 2 positions shown · non-contrast
Comparison: 11/05/2015

CLINICAL DATA: Cough when laying down, ongoing for 1 year.

EXAM:
CHEST  2 VIEW

[chest pa]
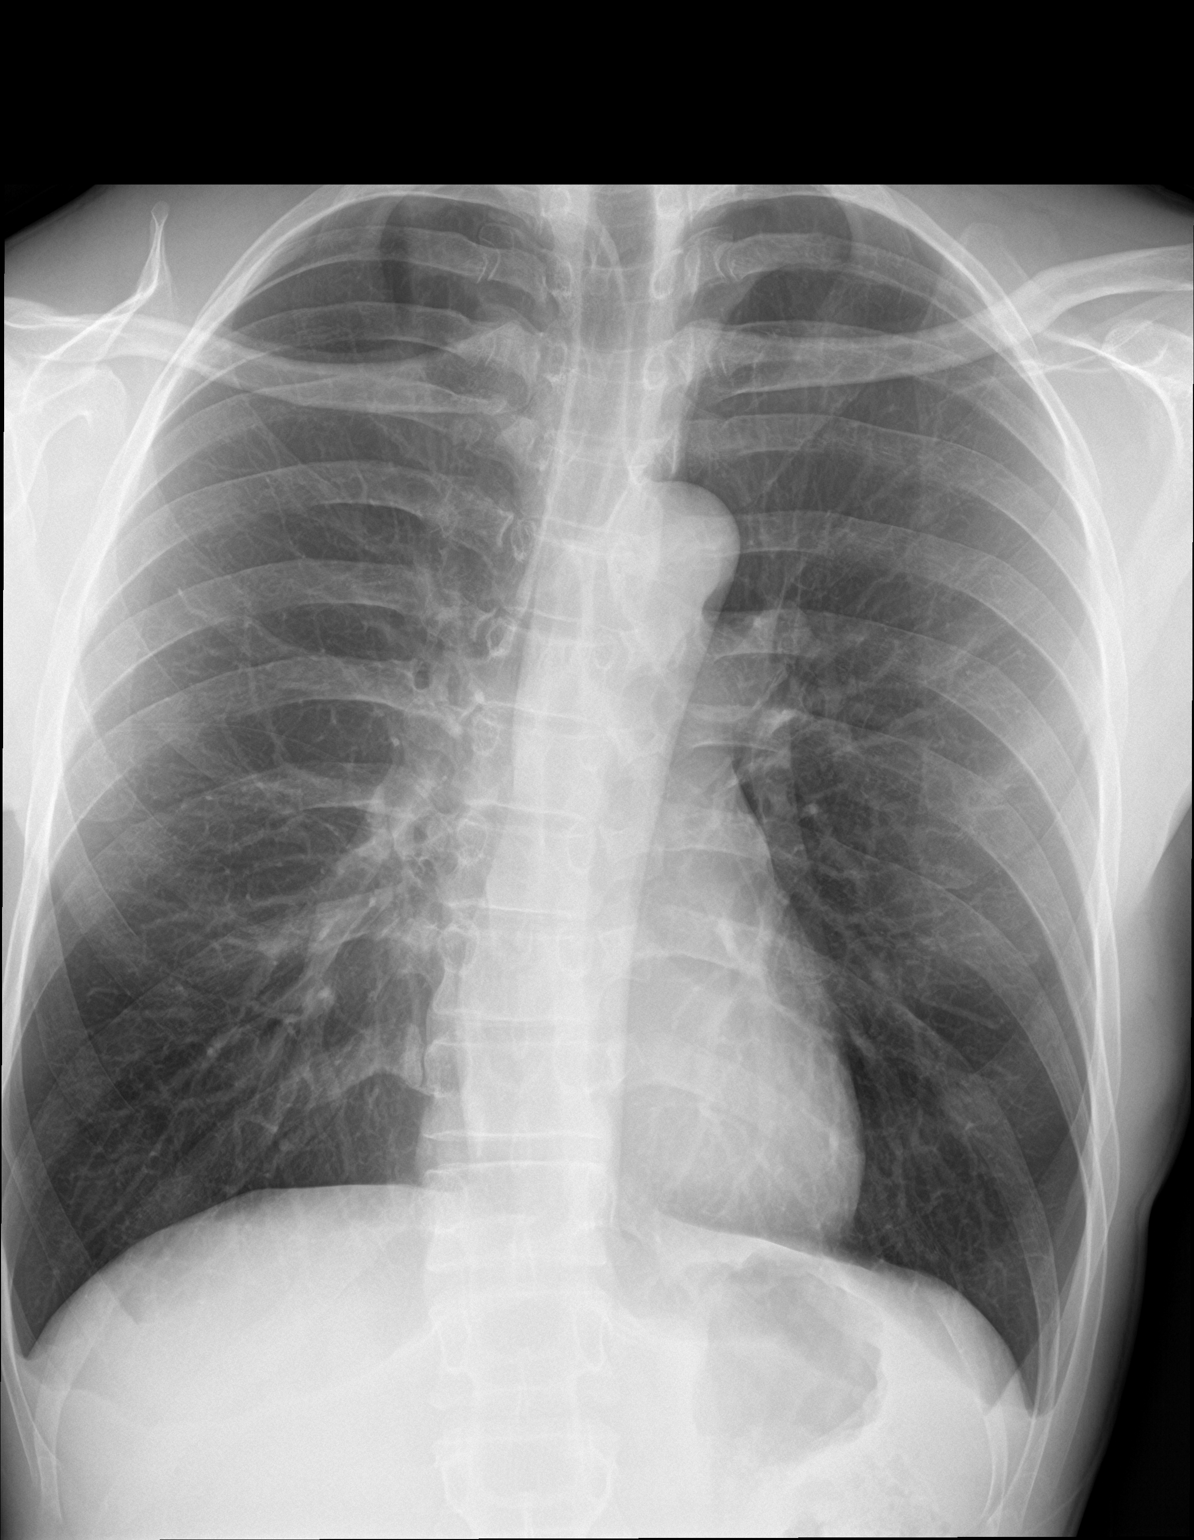

[chest lat]
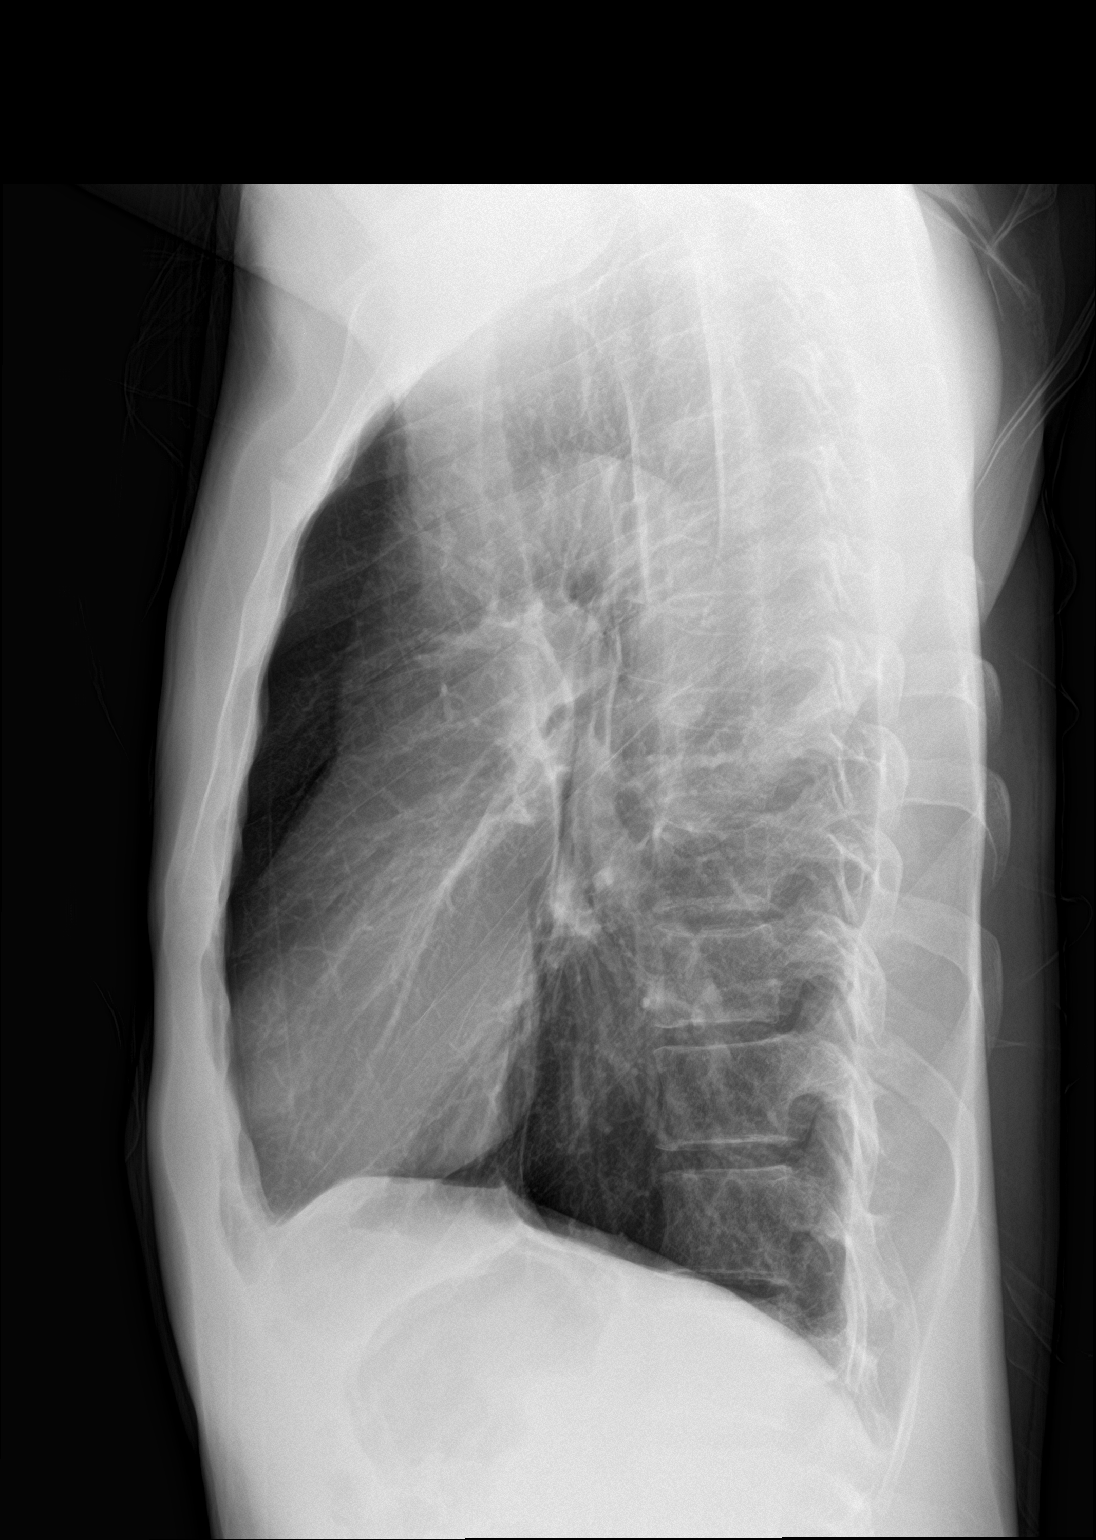

[2 of 2 positions shown; findings below may reference images not displayed]

FINDINGS: The heart size and mediastinal contours are within normal limits.
Both lungs are clear. Chronic straightening of the dorsal spine.
IMPRESSION: No active cardiopulmonary disease.

## 2018-12-18 ENCOUNTER — Ambulatory Visit (HOSPITAL_COMMUNITY)
Admission: EM | Admit: 2018-12-18 | Discharge: 2018-12-18 | Disposition: A | Discharge status: Home / Self Care | Payer: Self-pay | Attending: Family Medicine | Admitting: Family Medicine

## 2018-12-18 ENCOUNTER — Encounter (HOSPITAL_COMMUNITY): Payer: Self-pay | Admitting: Emergency Medicine

## 2018-12-18 ENCOUNTER — Other Ambulatory Visit: Payer: Self-pay

## 2018-12-18 DIAGNOSIS — R42 Dizziness and giddiness: Secondary | ICD-10-CM | POA: Insufficient documentation

## 2018-12-18 LAB — CBC
HCT: 40.5 % (ref 39.0–52.0)
Hemoglobin: 14.1 g/dL (ref 13.0–17.0)
MCH: 30.9 pg (ref 26.0–34.0)
MCHC: 34.8 g/dL (ref 30.0–36.0)
MCV: 88.8 fL (ref 80.0–100.0)
Platelets: 211 10*3/uL (ref 150–400)
RBC: 4.56 MIL/uL (ref 4.22–5.81)
RDW: 11.4 % — ABNORMAL LOW (ref 11.5–15.5)
WBC: 4.6 10*3/uL (ref 4.0–10.5)
nRBC: 0 % (ref 0.0–0.2)

## 2018-12-18 LAB — BASIC METABOLIC PANEL
Anion gap: 9 (ref 5–15)
BUN: 21 mg/dL — ABNORMAL HIGH (ref 6–20)
CO2: 26 mmol/L (ref 22–32)
Calcium: 9.4 mg/dL (ref 8.9–10.3)
Chloride: 103 mmol/L (ref 98–111)
Creatinine, Ser: 1.23 mg/dL (ref 0.61–1.24)
GFR calc Af Amer: >60 mL/min (ref >60)
GFR calc non Af Amer: >60 mL/min (ref >60)
Glucose, Bld: 93 mg/dL (ref 70–99)
Potassium: 4.6 mmol/L (ref 3.5–5.1)
Sodium: 138 mmol/L (ref 135–145)

## 2018-12-18 NOTE — Discharge Instructions (Addendum)
Stop taking your sister's blood pressure medicine. This could be causing or contributing to your symptoms.  Your evaluation this evening was not suggestive of any emergent condition requiring medical intervention at this time. Your ECG (heart tracing) did not show any worrisome changes. However, some medical problems make take more time to appear. Therefore, it's very important that you pay attention to any new symptoms or worsening of your current condition. We will call you with any significant abnormalities on your blood work drawn this evening.  Please proceed directly to the Emergency Department immediately should you feel worse in any way or have any of the following symptoms: increasing or different chest pain, pain that spreads to your arm, neck, jaw, back or abdomen, shortness of breath, or nausea and vomiting.

## 2018-12-18 NOTE — ED Triage Notes (Signed)
Patient reports intermittent episodes of dizziness and then calls episodes of lightheadedness.  Patient makes it sound like it wakes him from sleep.  Patient reports symptoms for 1 1/2 weeks.    At one point thought blood pressure was high, started taking 1/2 pill of twins blood pressure medicine.    Now patient says lightheadedness started after taking family members medicine.    Says he doesn't know why he took her blood pressure medicine.

## 2018-12-22 ENCOUNTER — Telehealth (HOSPITAL_COMMUNITY): Payer: Self-pay | Admitting: Emergency Medicine

## 2018-12-22 NOTE — Telephone Encounter (Signed)
Labs unremarkable. Attempted to reach patient. No answer at this time.   

## 2018-12-23 NOTE — ED Provider Notes (Signed)
[facility]   [id number] 12/18/18 Arrival Time: 4098  ASSESSMENT & PLAN:  1. Lightheadedness     Unclear etiology. Normal neurologic exam. No suspicion for ICH or SAH. No indication for neurodiagnostic imaging at this time. Discussed.  ECG: Interpreted by me. No acute or worrisome findings. NSR.  Pending: CBC, BMP  Reassured that these symptoms do not appear to represent a serious or threatening condition. Rest, avoid potentially dangerous activities (such as driving or working with machinery or at heights). Agrees to proceed to the ED if he develops other symptoms such as alterations of speech, swallowing, vision, motor/sensory systems, or if dizziness worsens.  Reviewed expectations re: course of current medical issues. Questions answered. Outlined signs and symptoms indicating need for more acute intervention. Patient verbalized understanding. After Visit Summary given.   SUBJECTIVE:  Dalton Mason is a 48 y.o. male who presents for evaluation of dizziness described as intermittent lightheadedness. Current symptoms began approx 1-1.5 weeks ago and have waxed and waned but are better overall. Frequency: "hard to tell; randomly happen" with episodes lasting few minutes to several hours. Aggravating factors: none identified. Positions that worsen symptoms: none. Denies coordination problems, gait problems, headaches, memory problems, paresthesia, seizures, speech problems, tremors, vertigo and weakness as well as tinnitus; without visual changes. Recent infections: none. Head trauma: denied. Drug ingestion: none. Noise exposure: no occupational exposure. No associated CP or SOB. Feels he is sleeping well at night. Previous workup/treatments: none.  Does reports that he initially thought his BP might have been elevated and causing his symptoms. Has been taking his sister's BP medication for the past several days; "kind of makes me feels worse".  ROS: As per HPI. All other  systems negative.    OBJECTIVE:  Vitals:   12/18/18 1836  BP: 122/86  Pulse: 79  Resp: 18  Temp: 97.9 F (36.6 C)  TempSrc: Oral  SpO2: 98%    General appearance: alert; no distress Eyes: PERRLA; EOMI; conjunctiva normal HENT: normocephalic; atraumatic; TMs normal; nasal mucosa normal; oral mucosa normal Neck: supple with FROM Lungs: clear to auscultation bilaterally; unlabored respirations Heart: regular rate and rhythm without murmer Abdomen: soft, non-tender; bowel sounds normal Extremities: no cyanosis or edema; symmetrical with no gross deformities Skin: warm and dry Neurologic: normal gait; DTR's normal and symmetric.; CN 2-12 grossly intact Psychological: alert and cooperative; normal mood and affect   No Known Allergies  PMH: Bronchitis.  Social History   Socioeconomic History  . Marital status: Single    Spouse name: Not on file  . Number of children: Not on file  . Years of education: Not on file  . Highest education level: Not on file  Occupational History  . Not on file  Social Needs  . Financial resource strain: Not on file  . Food insecurity    Worry: Not on file    Inability: Not on file  . Transportation needs    Medical: Not on file    Non-medical: Not on file  Tobacco Use  . Smoking status: Never Smoker  . Smokeless tobacco: Never Used  Substance and Sexual Activity  . Alcohol use: No  . Drug use: No  . Sexual activity: Not on file  Lifestyle  . Physical activity    Days per week: Not on file    Minutes per session: Not on file  . Stress: Not on file  Relationships  . Social connections    Talks on phone: Not on file  Gets together: Not on file    Attends religious service: Not on file    Active member of club or organization: Not on file    Attends meetings of clubs or organizations: Not on file    Relationship status: Not on file  . Intimate partner violence    Fear of current or ex partner: Not on file    Emotionally  abused: Not on file    Physically abused: Not on file    Forced sexual activity: Not on file  Other Topics Concern  . Not on file  Social History Narrative  . Not on file   FH: HTN  History reviewed. No pertinent surgical history.    Mardella LaymanHagler, Ly Wass, MD 12/23/18 [phone]

## 2021-01-25 ENCOUNTER — Encounter (HOSPITAL_COMMUNITY): Payer: Self-pay | Admitting: Emergency Medicine

## 2021-01-25 ENCOUNTER — Ambulatory Visit (HOSPITAL_COMMUNITY)
Admission: EM | Admit: 2021-01-25 | Discharge: 2021-01-25 | Disposition: A | Discharge status: Home / Self Care | Payer: Self-pay

## 2021-01-25 ENCOUNTER — Other Ambulatory Visit: Payer: Self-pay

## 2021-01-25 DIAGNOSIS — S76312A Strain of muscle, fascia and tendon of the posterior muscle group at thigh level, left thigh, initial encounter: Secondary | ICD-10-CM

## 2021-01-25 NOTE — Discharge Instructions (Signed)
You likely strained your left hamstring with the exercise that you did.  This improves in 6-8 weeks.  You can continue to take advil as needed and use topicals.  Look up the Askling Hamstring Exercises to help heal your hamstring.  If you don't improve in the next month, you should go to Sports Medicine as this could be coming from your back.

## 2021-01-25 NOTE — ED Provider Notes (Signed)
[facility]    CSN: [id number] Arrival date & time: 01/25/21  1053      History   Chief Complaint Chief Complaint  Patient presents with   Leg Pain    HPI Dalton Mason is a 50 y.o. male.   Left Posterior Leg Pain Started about 1 month ago after doing lunges with weights which he hadn't done before No back pain Pain at ischial tuberosity and into the mid substance of hamstring Denies any numbness and tingling Denies any back pain or buttocks pain Denies any saddle anesthesias, change in bowel or bladder habits, leg weakness States that the pain worsens when he stretches his hamstrings Feels better when he is up and walking around, hurts more with he starts to run very quickly and after resting Has been doing Advil, icy hot, heating pad with some improvement States overall it is improving, but he is concerned that he has a muscle strain and wants to know what to do about this   History reviewed. No pertinent past medical history.  There are no problems to display for this patient.   History reviewed. No pertinent surgical history.     Home Medications    Prior to Admission medications   Medication Sig Start Date End Date Taking? Authorizing Provider  ibuprofen (ADVIL,MOTRIN) 200 MG tablet Take 200 mg by mouth every 6 (six) hours as needed.   Yes [provider]  cetirizine-pseudoephedrine (ZYRTEC-D) 5-120 MG tablet Take 1 tablet by mouth daily. Patient not taking: Reported on 01/25/2021 11/05/15   Jaynie Crumble, PA-C  NON FORMULARY Family members blood pressure medicine, 1/2 tablet of a red tab    [provider]  omeprazole (PRILOSEC) 20 MG capsule Take 1 capsule (20 mg total) by mouth daily. Patient not taking: Reported on 01/25/2021 05/25/16   Elvina Sidle, MD  predniSONE (DELTASONE) 20 MG tablet Two daily with food Patient not taking: Reported on 01/25/2021 05/25/16   Elvina Sidle, MD    Family History History  reviewed. No pertinent family history.  Social History Social History   Tobacco Use   Smoking status: Never   Smokeless tobacco: Never  Vaping Use   Vaping Use: Never used  Substance Use Topics   Alcohol use: No   Drug use: No     Allergies   Patient has no known allergies.   Review of Systems Review of Systems  All other systems reviewed and are negative. Per HPI Physical Exam Triage Vital Signs ED Triage Vitals  Enc Vitals Group     BP 01/25/21 1229 127/84     Pulse Rate 01/25/21 1229 72     Resp 01/25/21 1229 18     Temp 01/25/21 1229 98 F (36.7 C)     Temp Source 01/25/21 1229 Oral     SpO2 01/25/21 1229 98 %     Weight --      Height --      Head Circumference --      Peak Flow --      Pain Score 01/25/21 1226 7     Pain Loc --      Pain Edu? --      Excl. in GC? --    No data found.  Updated Vital Signs BP 127/84 (BP Location: Right Arm)   Pulse 72   Temp 98 F (36.7 C) (Oral)   Resp 18   SpO2 98%   Visual Acuity Right Eye Distance:   Left Eye Distance:  Bilateral Distance:    Right Eye Near:   Left Eye Near:    Bilateral Near:     Physical Exam Constitutional:      General: He is not in acute distress.    Appearance: Normal appearance. He is not ill-appearing or toxic-appearing.  HENT:     Head: Normocephalic and atraumatic.  Pulmonary:     Effort: Pulmonary effort is normal. No respiratory distress.  Musculoskeletal:     Comments: Lumbar spine: - Inspection: no gross deformity or asymmetry, swelling or ecchymosis - Palpation: No TTP over the spinous processes, paraspinal muscles, or SI joints b/l.  He does have tenderness to palpation at the left ischial tuberosity and into the mid substance region of his left posterior hamstring. - ROM: full active ROM of the lumbar spine in flexion and extension without pain, he does have a pulling sensation within his hamstring with stretching specifically of his left hamstring. - Strength: 5/5  strength of lower extremity in L4-S1 nerve root distributions b/l; normal gait - Neuro: sensation intact in the L4-S1 nerve root distribution b/l - Special testing: Negative seated slump    Neurological:     Mental Status: He is alert and oriented to person, place, and time.     Sensory: No sensory deficit.     UC Treatments / Results  Labs (all labs ordered are listed, but only abnormal results are displayed) Labs Reviewed - No data to display  EKG   Radiology No results found.  Procedures Procedures (including critical care time)  Medications Ordered in UC Medications - No data to display  Initial Impression / Assessment and Plan / UC Course  I have reviewed the triage vital signs and the nursing notes.  Pertinent labs & imaging results that were available during my care of the patient were reviewed by me and considered in my medical decision making (see chart for details).     Patient is a 50 year old who presents with left posterior hamstring pain.  Discussed with him that his findings are most consistent with a hamstring strain given his mechanism of injury.  He is having improvement, advised that this can take 6 to 8 weeks to completely resolve.  Did discuss that this could possibly be from an irritation from his back, however less likely at this time.  Continue supportive care, start Asklying hamstring exercises for rehabilitation.  He can follow-up with sports medicine in 1 month if not improving.   Final Clinical Impressions(s) / UC Diagnoses   Final diagnoses:  Strain of left hamstring, initial encounter     Discharge Instructions      You likely strained your left hamstring with the exercise that you did.  This improves in 6-8 weeks.  You can continue to take advil as needed and use topicals.  Look up the Askling Hamstring Exercises to help heal your hamstring.  If you don't improve in the next month, you should go to Sports Medicine as this could be coming  from your back.       ED Prescriptions   None    PDMP not reviewed this encounter.   Unknown Jim, DO 01/25/21 1313

## 2021-01-25 NOTE — ED Triage Notes (Signed)
One month ago, patient started a new exercise routine.  Patient was doing lounges while holding dumbbells.  No pain at the time, but the next day started having left buttocks and where buttocks meets left thigh is another painful area, radiating to mid left thigh Pain has constant pain, but worsens intermittently.  Patient seems to feel better after moving around.  Has used advil and icy hot remedies.

## 2021-02-09 ENCOUNTER — Ambulatory Visit: Payer: Self-pay

## 2021-02-09 ENCOUNTER — Ambulatory Visit (INDEPENDENT_AMBULATORY_CARE_PROVIDER_SITE_OTHER): Payer: Self-pay | Admitting: Sports Medicine

## 2021-02-09 VITALS — BP 134/92 | Ht 68.5 in | Wt 135.0 lb

## 2021-02-09 DIAGNOSIS — M79605 Pain in left leg: Secondary | ICD-10-CM

## 2021-02-09 NOTE — Patient Instructions (Signed)
Thank you for coming to see me today. It was a pleasure. Today we talked about:   I have placed an order for x-rays of your back.  Please go to [facility] to have this completed.  You do not need an appointment.  We will contact you with your results afterwards.  These x-rays will help [place] determine what exercises you need to do to help with your pain.  If you have any questions or concerns, please do not hesitate to call the office at [phone].  Best,   Luis Abed, DO [facility]

## 2021-02-09 NOTE — Assessment & Plan Note (Signed)
Given that his symptoms are not improving and it has been over 6 weeks, more suspicious for lumbar pathology, specifically discogenic pain, then hamstring pain.  Reassured by his normal hamstring ultrasound.  Patient educated on lumbar radicular patterns and hamstring tightness that occurs with this.  Will obtain lumbar spine films and determine best course for treatment from here.  Reassuringly, he is not very limited in his daily activities from this.  We will have him continue with ibuprofen and contact him with x-ray results.  Could consider an MRI in the future if this significantly worsens.

## 2021-02-09 NOTE — Progress Notes (Signed)
Dalton Mason is a 50 y.o. male who presents to [facility] today for the following:  Left hamstring pain Seen in urgent care on 10/12 for the same At that time was advised to perform Askling exercises for rehabilitation He has not done any Askling exercises, reports that he has been stretching and doing exercises on his own States that since the pain initially started 6 months ago, he has not had any change with this Does report that the pain worsens when he is sitting upright at times and feels that it feels better when he lays back Also occasionally feels some tightness when he is walking Does did start initially when he was doing lunges Reports that the pain is still at the ischial tuberosity and in the mid substance of the hamstring Uses icy hot and heating pad which sometimes helps Also reports that he has been having some tingling on the bottom of his left foot that rarely occurs, but usually does occur with the pain in his leg This is new from when he was previously seen at urgent care He is taking ibuprofen 600 mg about once a day which helps with his pain He denies any back pain Denies saddle anesthesias, changes in bowel or bladder habits, leg weakness   PMH reviewed.  ROS as above. Medications reviewed.  Exam:  BP (!) 134/92   Ht 5' 8.5" (1.74 m)   Wt 135 lb (61.2 kg)   BMI 20.23 kg/m  Gen: Well NAD MSK:  Lumbar spine and lower extremities: - Inspection: no gross deformity or asymmetry, swelling or ecchymosis - Palpation: No TTP over the spinous processes, paraspinal muscles, or SI joints b/l - ROM: full active ROM of the lumbar spine in flexion and extension without pain - Strength: 5/5 strength of lower extremity in L4-S1 nerve root distributions b/l; normal gait, no pain with resisted hamstring strength - Neuro: sensation intact in the L4-S1 nerve root distribution b/l, 2+ L4 and S1 reflexes - Special testing: Negative straight leg raise, negative slump, negative  Stork test, Negative FABER, negative FADIR bilaterally  Limited ultrasound left hamstring Patient's left hamstrings were visualized in both long and short axis starting at the ischial tuberosity and extending into the distal hamstring.  There was no significant abnormalities noted within the hamstring tendons or musculature to suggest strain or tear.  Impression: Normal left hamstring ultrasound  Ultrasound and interpretation by T. Frazier Butt, DO and Luis Abed, DO    No results found.   Assessment and Plan: 1) Left leg pain Given that his symptoms are not improving and it has been over 6 weeks, more suspicious for lumbar pathology, specifically discogenic pain, then hamstring pain.  Reassured by his normal hamstring ultrasound.  Patient educated on lumbar radicular patterns and hamstring tightness that occurs with this.  Will obtain lumbar spine films and determine best course for treatment from here.  Reassuringly, he is not very limited in his daily activities from this.  We will have him continue with ibuprofen and contact him with x-ray results.  Could consider an MRI in the future if this significantly worsens.   Luis Abed, D.O.  PGY-4 [facility]  02/09/2021 3:18 PM  Patient seen and evaluated with the sports medicine fellow.  I agree with the above plan of care.  X-rays of the lumbar spine with phone follow-up with results when available.  He would likely benefit from formal physical therapy in the future.  His pain sounds discogenic  in nature so therapy should concentrate on spine neutral exercises as well as McKenzie extension exercises.  We will discuss this further after I review his x-rays.

## 2024-04-22 ENCOUNTER — Encounter (HOSPITAL_COMMUNITY): Payer: Self-pay | Admitting: Emergency Medicine

## 2024-04-22 ENCOUNTER — Ambulatory Visit (HOSPITAL_COMMUNITY)
Admission: EM | Admit: 2024-04-22 | Discharge: 2024-04-22 | Disposition: A | Discharge status: Home / Self Care | Payer: Self-pay | Attending: Internal Medicine | Admitting: Internal Medicine

## 2024-04-22 DIAGNOSIS — K219 Gastro-esophageal reflux disease without esophagitis: Secondary | ICD-10-CM

## 2024-04-22 HISTORY — DX: Gastro-esophageal reflux disease without esophagitis: K21.9

## 2024-04-22 HISTORY — DX: Ventral hernia without obstruction or gangrene: K43.9

## 2024-04-22 MED ORDER — OMEPRAZOLE 20 MG PO CPDR: 20.0000 mg | DELAYED_RELEASE_CAPSULE | Freq: Every day | ORAL | 0 refills | Status: AC

## 2024-04-22 NOTE — ED Provider Notes (Signed)
 " [facility]    CSN: [id number] Arrival date & time: 04/22/24  1022      History   Chief Complaint Chief Complaint  Patient presents with   Heartburn    HPI Dalton Mason is a 54 y.o. male presenting to urgent care with a chief complaint of heartburn.  He describes an intermittent history of acid reflux for nearly 19 years.  He also reports a history of inguinal hernia.  He typically manages symptoms with home remedies and avoidance of foods known to trigger his symptoms.  Recently, he went out to eat with friends and several of them developed food poisoning.  He states that he had several episodes of diarrhea.  Denies any episodes of nausea or vomiting.  Since that time his reflux symptoms have been worse, particular with spicy foods and with lying supine.  He denies cough/sputum production, shortness of breath, chest pain, or worsening of symptoms with exertion.  He continues to deny nausea/vomiting.  Diarrhea has resolved.  Lastly denies fever/chills.    Past Medical History:  Diagnosis Date   Acid reflux    Hernia of abdominal wall     Patient Active Problem List   Diagnosis Date Noted   Left leg pain 02/09/2021    History reviewed. No pertinent surgical history.     Home Medications    Prior to Admission medications  Medication Sig Start Date End Date Taking? Authorizing Provider  omeprazole  (PRILOSEC) 20 MG capsule Take 1 capsule (20 mg total) by mouth daily. 04/22/24  Yes Melvenia Manus BRAVO, MD    Family History No family history on file.  Social History Social History[1]   Allergies   Patient has no known allergies.   Review of Systems Review of Systems  Constitutional:  Negative for chills, fatigue and fever.  HENT:  Negative for congestion and sore throat.   Respiratory:  Negative for chest tightness and shortness of breath.   Cardiovascular:  Negative for chest pain and palpitations.  Gastrointestinal:  Positive for abdominal pain  (Epigastric). Negative for blood in stool, constipation, diarrhea, nausea and vomiting.  Genitourinary:  Negative for hematuria, penile pain and testicular pain.  Musculoskeletal:  Negative for arthralgias and myalgias.  Skin:  Negative for rash and wound.  All other systems reviewed and are negative.    Physical Exam Triage Vital Signs ED Triage Vitals  Encounter Vitals Group     BP 04/22/24 1230 (!) 152/103     Girls Systolic BP Percentile --      Girls Diastolic BP Percentile --      Boys Systolic BP Percentile --      Boys Diastolic BP Percentile --      Pulse Rate 04/22/24 1230 80     Resp 04/22/24 1230 17     Temp 04/22/24 1230 98.2 F (36.8 C)     Temp Source 04/22/24 1230 Oral     SpO2 04/22/24 1230 96 %     Weight --      Height --      Head Circumference --      Peak Flow --      Pain Score 04/22/24 1229 0     Pain Loc --      Pain Education --      Exclude from Growth Chart --    No data found.  Updated Vital Signs BP (!) 152/103 (BP Location: Left Arm)   Pulse 80   Temp 98.2 F (36.8 C) (  Oral)   Resp 17   SpO2 96%   Visual Acuity Right Eye Distance:   Left Eye Distance:   Bilateral Distance:    Right Eye Near:   Left Eye Near:    Bilateral Near:     Physical Exam Vitals reviewed.  Constitutional:      General: He is not in acute distress.    Appearance: Normal appearance. He is not toxic-appearing.  HENT:     Head: Normocephalic and atraumatic.     Right Ear: External ear normal.     Left Ear: External ear normal.     Nose: Nose normal. No congestion or rhinorrhea.     Mouth/Throat:     Mouth: Mucous membranes are moist.     Pharynx: Oropharynx is clear. No oropharyngeal exudate or posterior oropharyngeal erythema.  Eyes:     General: No scleral icterus.    Extraocular Movements: Extraocular movements intact.     Conjunctiva/sclera: Conjunctivae normal.     Pupils: Pupils are equal, round, and reactive to light.  Cardiovascular:      Rate and Rhythm: Normal rate and regular rhythm.     Pulses: Normal pulses.     Heart sounds: Normal heart sounds.  Pulmonary:     Effort: Pulmonary effort is normal.     Breath sounds: Normal breath sounds. No wheezing, rhonchi or rales.  Chest:     Chest wall: No tenderness.  Abdominal:     General: Abdomen is flat. Bowel sounds are normal.     Palpations: Abdomen is soft.     Tenderness: There is abdominal tenderness (Dull TTP over epigastric region). There is no right CVA tenderness, left CVA tenderness, guarding or rebound.  Musculoskeletal:     Cervical back: Normal range of motion.  Lymphadenopathy:     Cervical: No cervical adenopathy.  Skin:    General: Skin is warm and dry.     Coloration: Skin is not jaundiced.  Neurological:     Mental Status: He is alert.    UC Treatments / Results  Labs (all labs ordered are listed, but only abnormal results are displayed) Labs Reviewed - No data to display  EKG   Radiology No results found.  Procedures Procedures (including critical care time)  Medications Ordered in UC Medications - No data to display  Initial Impression / Assessment and Plan / UC Course  I have reviewed the triage vital signs and the nursing notes.  Pertinent labs & imaging results that were available during my care of the patient were reviewed by me and considered in my medical decision making (see chart for details).    Patient is a 54 year old male presenting to urgent care endorsing heartburn.  He reports a history of GERD.  Symptoms are typically controlled with avoidance of known triggers and home remedies.  Symptoms recently worsened after what he describes as food poisoning.  Symptoms occur intermittently, not associated with exertion, worse with lying supine.  His history and exam findings today are most consistent with GERD vs PUD vs gastritis.  Treatment options reviewed.  Recommend trial of PPI and continued avoidance of known triggers.  I  prescribed omeprazole  20 mg daily.  I recommended that he establish care with a primary care provider for long-term management.  I recommended that he return to urgent care if symptoms worsen in frequency or intensity, do not improve with PPI use, or he develops hematemesis.  He expressed understanding and agreement with the plan stated above.  He is medically stable for discharge at this time.  Final Clinical Impressions(s) / UC Diagnoses   Final diagnoses:  Gastroesophageal reflux disease, unspecified whether esophagitis present     Discharge Instructions      Your symptoms seem most consistent with gastric reflux. As we discussed, I recommend avoiding foods known to trigger your symptoms. I prescribed omeprazole  20 mg daily to help with symptom relief. I recommend finding a primary care doctor for long term management. Return to urgent care if symptoms worsen in intensity or frequency, symptoms do not improve with omeprazole , or you develop vomiting with bright red blood or that is coffee colored.     ED Prescriptions     Medication Sig Dispense Auth. Provider   omeprazole  (PRILOSEC) 20 MG capsule Take 1 capsule (20 mg total) by mouth daily. 30 capsule Lizzett Nobile E, MD      PDMP not reviewed this encounter.    [1]  Social History Tobacco Use   Smoking status: Never   Smokeless tobacco: Never  Vaping Use   Vaping status: Never Used  Substance Use Topics   Alcohol use: No   Drug use: No     Melvenia Manus BRAVO, MD 04/22/24 1307  "

## 2024-04-22 NOTE — ED Triage Notes (Signed)
 Pt reports hx acid reflux and hernia for years. Not currently taking any medications for acid reflux. Tried home remedy of water and baking powder. Reports gas build up. Reports will bother him at night when lays down some now.  Pt reports week or 2 ago had bowel problems along with others after eating at [facility].

## 2024-04-22 NOTE — Discharge Instructions (Signed)
 Your symptoms seem most consistent with gastric reflux. As we discussed, I recommend avoiding foods known to trigger your symptoms. I prescribed omeprazole  20 mg daily to help with symptom relief. I recommend finding a primary care doctor for long term management. Return to urgent care if symptoms worsen in intensity or frequency, symptoms do not improve with omeprazole , or you develop vomiting with bright red blood or that is coffee colored.
